# Patient Record
Sex: Male | Born: 1990 | Race: Black or African American | Hispanic: No | Marital: Single | Smoking: Never smoker
Health system: Southern US, Community
[De-identification: ages and names within clinical notes are randomized; demographics above are authoritative.]

---
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
## 2011-11-04 ENCOUNTER — Emergency Department (HOSPITAL_COMMUNITY)
Admission: EM | Admit: 2011-11-04 | Discharge: 2011-11-04 | Disposition: A | Discharge status: Home / Self Care | Payer: Medicaid Other | Attending: Emergency Medicine | Admitting: Emergency Medicine

## 2011-11-04 ENCOUNTER — Emergency Department (HOSPITAL_COMMUNITY): Payer: Medicaid Other

## 2011-11-04 DIAGNOSIS — Y92838 Other recreation area as the place of occurrence of the external cause: Secondary | ICD-10-CM | POA: Insufficient documentation

## 2011-11-04 DIAGNOSIS — S92109A Unspecified fracture of unspecified talus, initial encounter for closed fracture: Secondary | ICD-10-CM | POA: Insufficient documentation

## 2011-11-04 DIAGNOSIS — X500XXA Overexertion from strenuous movement or load, initial encounter: Secondary | ICD-10-CM | POA: Insufficient documentation

## 2011-11-04 DIAGNOSIS — M25476 Effusion, unspecified foot: Secondary | ICD-10-CM | POA: Insufficient documentation

## 2011-11-04 DIAGNOSIS — M25473 Effusion, unspecified ankle: Secondary | ICD-10-CM | POA: Insufficient documentation

## 2011-11-04 DIAGNOSIS — Y9367 Activity, basketball: Secondary | ICD-10-CM | POA: Insufficient documentation

## 2011-11-04 DIAGNOSIS — M25579 Pain in unspecified ankle and joints of unspecified foot: Secondary | ICD-10-CM | POA: Insufficient documentation

## 2011-11-04 DIAGNOSIS — S8990XA Unspecified injury of unspecified lower leg, initial encounter: Secondary | ICD-10-CM | POA: Insufficient documentation

## 2011-11-04 DIAGNOSIS — S82899A Other fracture of unspecified lower leg, initial encounter for closed fracture: Secondary | ICD-10-CM

## 2011-11-04 DIAGNOSIS — Y9239 Other specified sports and athletic area as the place of occurrence of the external cause: Secondary | ICD-10-CM | POA: Insufficient documentation

## 2011-11-04 MED ORDER — ONDANSETRON HCL 4 MG PO TABS
4.0000 mg | ORAL_TABLET | Freq: Once | ORAL | Status: AC
  Administered 2011-11-04: 4 mg via ORAL
  Filled 2011-11-04: qty 1

## 2011-11-04 MED ORDER — IBUPROFEN 800 MG PO TABS: 800.0000 mg | ORAL_TABLET | Freq: Three times a day (TID) | ORAL | Status: AC

## 2011-11-04 MED ORDER — IBUPROFEN 800 MG PO TABS
800.0000 mg | ORAL_TABLET | Freq: Once | ORAL | Status: AC
  Administered 2011-11-04: 800 mg via ORAL
  Filled 2011-11-04: qty 1

## 2011-11-04 MED ORDER — HYDROCODONE-ACETAMINOPHEN 5-325 MG PO TABS: 1.0000 | ORAL_TABLET | ORAL | Status: AC | PRN

## 2011-11-04 MED ORDER — HYDROCODONE-ACETAMINOPHEN 5-325 MG PO TABS
2.0000 | ORAL_TABLET | Freq: Once | ORAL | Status: AC
  Administered 2011-11-04: 2 via ORAL
  Filled 2011-11-04: qty 2

## 2011-11-04 NOTE — ED Notes (Signed)
Pt states hurt/twisted ankle while playing basket ball this afternoon. Good cap refill and pulses palpable in foot. ASO applied per orders.

## 2011-11-04 NOTE — ED Provider Notes (Signed)
History     CSN: [id number] Arrival date & time: 11/04/2011  7:46 PM   First MD Initiated Contact with Patient 11/04/11 2024      Chief Complaint  Patient presents with  . Foot Injury    (Consider location/radiation/quality/duration/timing/severity/associated sxs/prior treatment) HPI Comments: Patient was playing basketball today. While going to the basket he heard a pop and felt a pain in his right ankle. After this he had pain to the top of his foot extending to the ankle. Pain was worse with attempting to walk or to flex or extend his ankle. The patient denies previous injuries or surgeries to the right ankle. He has not had any problems with the ankle prior to today's event. He presents for evaluation of his ankle to rule out fracture.  Patient is a 20 y.o. male presenting with foot injury. The history is provided by the patient and a relative.  Foot Injury  The incident occurred 6 to 12 hours ago. The incident occurred at school. Injury mechanism: playing basket ball. The pain is present in the right ankle. The quality of the pain is described as sharp. The pain is severe. The pain has been constant since onset. Associated symptoms include inability to bear weight. Pertinent negatives include no numbness. He reports no foreign bodies present. The symptoms are aggravated by bearing weight. He has tried nothing for the symptoms.    History reviewed. No pertinent past medical history.  History reviewed. No pertinent past surgical history.  No family history on file.  History  Substance Use Topics  . Smoking status: Never Smoker   . Smokeless tobacco: Not on file  . Alcohol Use: No      Review of Systems  Constitutional: Negative for activity change.       All ROS Neg except as noted in HPI  HENT: Negative for nosebleeds and neck pain.   Eyes: Negative for photophobia and discharge.  Respiratory: Negative for cough, shortness of breath and wheezing.   Cardiovascular:  Negative for chest pain and palpitations.  Gastrointestinal: Negative for abdominal pain and blood in stool.  Genitourinary: Negative for dysuria, frequency and hematuria.  Musculoskeletal: Positive for arthralgias. Negative for back pain.  Skin: Negative.   Neurological: Negative for dizziness, seizures, speech difficulty and numbness.  Psychiatric/Behavioral: Negative for hallucinations and confusion.    Allergies  Review of patient's allergies indicates no known allergies.  Home Medications  No current outpatient prescriptions on file.  BP 128/73  Pulse 60  Temp(Src) 98.3 F (36.8 C) (Oral)  Resp 18  SpO2 100%  Physical Exam  Nursing note and vitals reviewed. Constitutional: He is oriented to person, place, and time. He appears well-developed and well-nourished.  Non-toxic appearance.  HENT:  Head: Normocephalic.  Right Ear: Tympanic membrane and external ear normal.  Left Ear: Tympanic membrane and external ear normal.  Eyes: EOM and lids are normal. Pupils are equal, round, and reactive to light.  Neck: Normal range of motion. Neck supple. Carotid bruit is not present.  Cardiovascular: Normal rate, regular rhythm, normal heart sounds, intact distal pulses and normal pulses.   Pulmonary/Chest: Breath sounds normal. No respiratory distress.  Abdominal: Soft. Bowel sounds are normal. There is no tenderness. There is no guarding.  Musculoskeletal: Normal range of motion.       Pain to palpation of the dorsum of the right ankle. Mild to moderate swelling of this area. Pain with attempted range of motion of the ankle. Achilles tendon is intact. Distal  pulses are symmetrical bilaterally. The patient has excellent capillary refill.  Lymphadenopathy:       Head (right side): No submandibular adenopathy present.       Head (left side): No submandibular adenopathy present.    He has no cervical adenopathy.  Neurological: He is alert and oriented to person, place, and time. He has  normal strength. No cranial nerve deficit or sensory deficit.  Skin: Skin is warm and dry.  Psychiatric: He has a normal mood and affect. His speech is normal.    ED Course  Procedures (including critical care time) Pulse oximetry 100% on room air. Within normal limits by my interpretation. Labs Reviewed - No data to display Dg Foot Complete Right  11/04/2011  *RADIOLOGY REPORT*  Clinical Data: Mid foot pain following twisting injury today.  RIGHT FOOT COMPLETE - 3+ VIEW  Comparison: None.  Findings: There is slight cortical irregularity along the dorsal aspect of the talar head on the lateral view which could reflect a small avulsion fracture.  There is no other evidence of acute fracture or subluxation.  There may be mild dorsal midfoot soft tissue swelling.  IMPRESSION: Possible small avulsion fracture from the dorsal aspect of the talar head.  No other acute osseous findings identified.  Original Report Authenticated By: Gerrianne Scale, M.D.     Dx: 1. Avulsion fracture of the talus of the right ankle.   MDM  I have reviewed nursing notes, vital signs, and all appropriate lab and imaging results for this patient.        Kathie Dike, [place] 11/04/11 2041

## 2011-11-04 NOTE — ED Notes (Signed)
Pt reports falling today while playing basketball and injuring his rt foot.  Pt reports severe pain to the top of his foot upon movement.  Pedal pulses present.

## 2011-11-05 NOTE — ED Provider Notes (Signed)
Medical screening examination/treatment/procedure(s) were performed by non-physician practitioner and as supervising physician I was immediately available for consultation/collaboration.  Nathalee Smarr, MD 11/05/11 0017 

## 2012-11-19 IMAGING — CR DG FOOT COMPLETE 3+V*R*
3 series · 3 of 3 positions shown · non-contrast
Comparison: None.

CLINICAL DATA: Mid foot pain following twisting injury today.

RIGHT FOOT COMPLETE - 3+ VIEW

[view not recorded (1 of 3)]
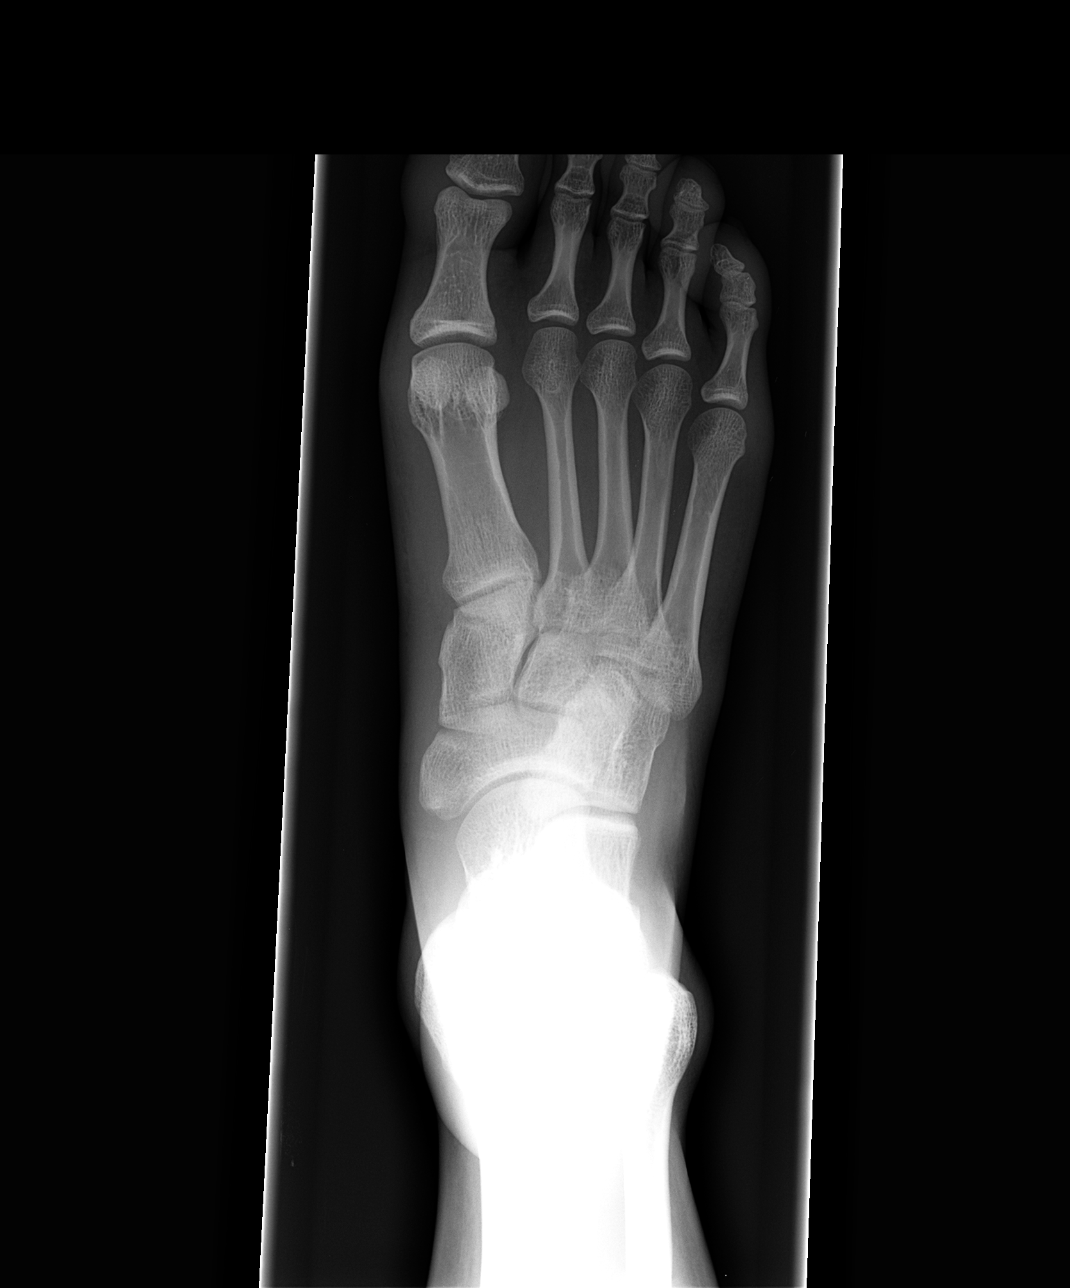

[view not recorded (2 of 3)]
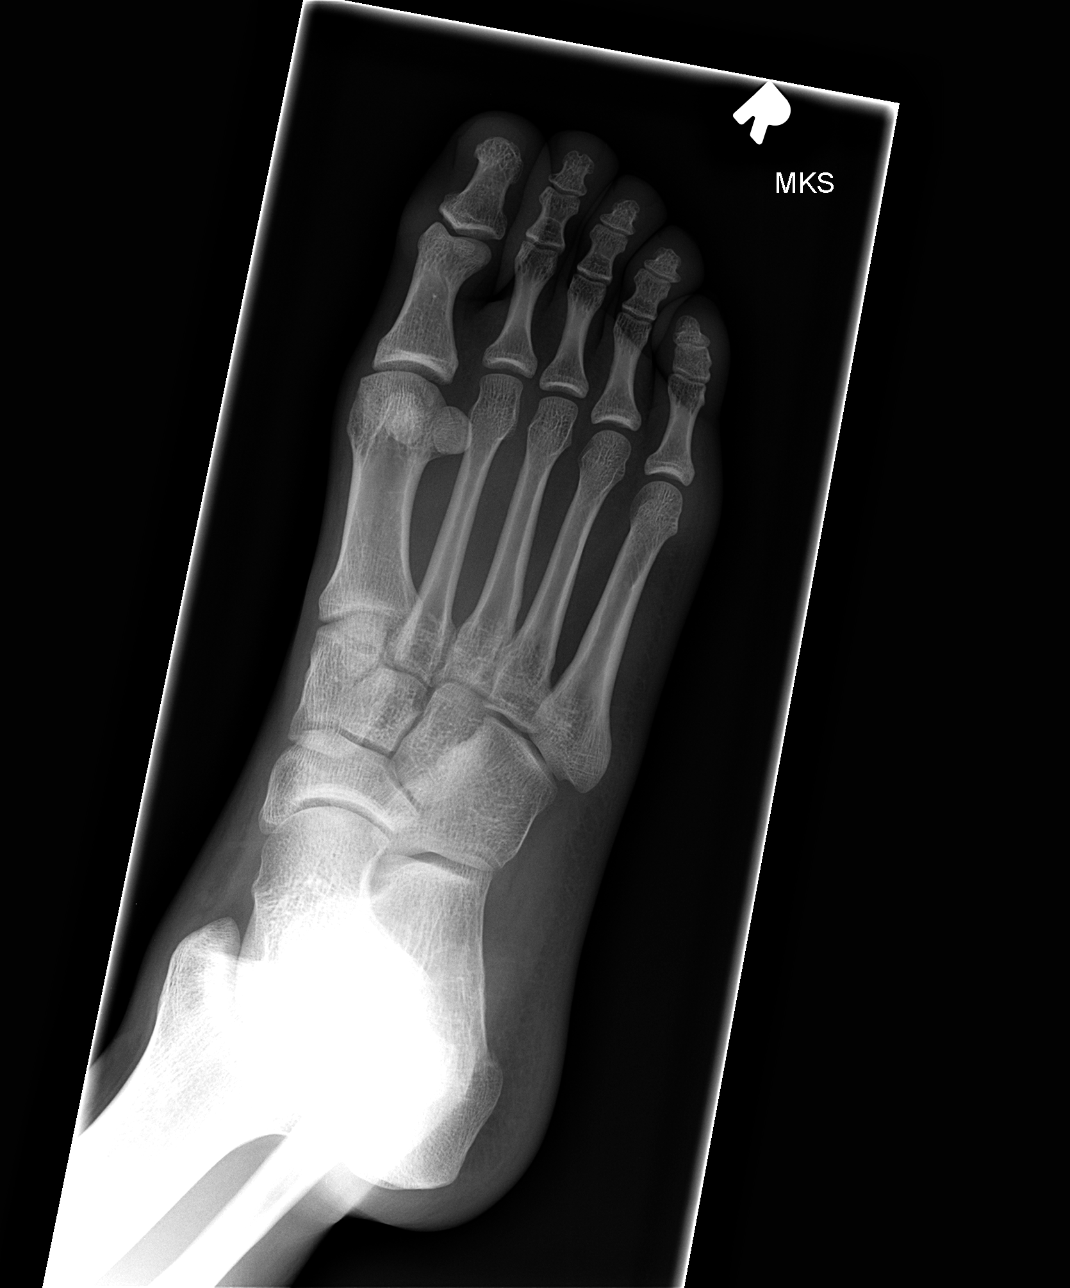

[view not recorded (3 of 3)]
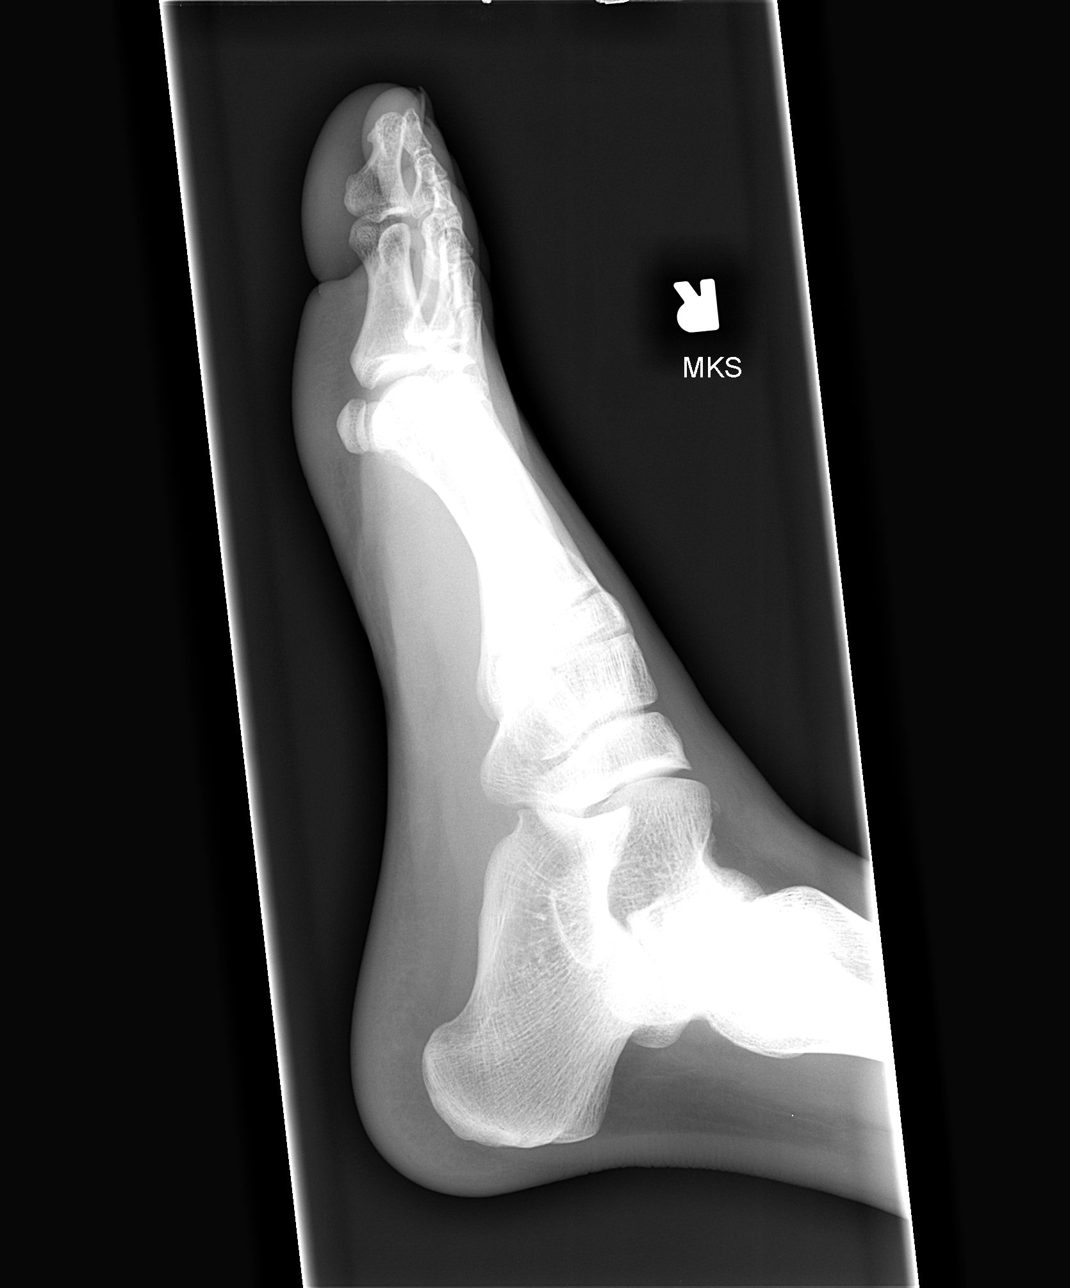

[3 of 3 positions shown; findings below may reference images not displayed]

FINDINGS: There is slight cortical irregularity along the dorsal
aspect of the talar head on the lateral view which could reflect a
small avulsion fracture.  There is no other evidence of acute
fracture or subluxation.  There may be mild dorsal midfoot soft
tissue swelling.
IMPRESSION: Possible small avulsion fracture from the dorsal aspect of the
talar head.  No other acute osseous findings identified.

## 2013-10-24 ENCOUNTER — Ambulatory Visit: Payer: Self-pay | Admitting: Family Medicine

## 2014-02-27 ENCOUNTER — Telehealth: Payer: Self-pay | Admitting: Family Medicine

## 2014-02-27 NOTE — Telephone Encounter (Signed)
Patient aware we have no appts for a complete physical today
